# Patient Record
Sex: Female | Born: 1950 | Race: White | Hispanic: No | Marital: Married
Health system: Southern US, Community
[De-identification: ages and names within clinical notes are randomized; demographics above are authoritative.]

---

## 1997-09-02 ENCOUNTER — Other Ambulatory Visit: Admission: RE | Admit: 1997-09-02 | Discharge: 1997-09-02 | Payer: Self-pay | Admitting: Obstetrics and Gynecology

## 1998-03-02 ENCOUNTER — Ambulatory Visit (HOSPITAL_COMMUNITY): Admission: RE | Admit: 1998-03-02 | Discharge: 1998-03-02 | Payer: Self-pay | Admitting: Obstetrics and Gynecology

## 1998-10-07 ENCOUNTER — Other Ambulatory Visit: Admission: RE | Admit: 1998-10-07 | Discharge: 1998-10-07 | Payer: Self-pay | Admitting: Obstetrics and Gynecology

## 1999-03-09 ENCOUNTER — Ambulatory Visit (HOSPITAL_COMMUNITY): Admission: RE | Admit: 1999-03-09 | Discharge: 1999-03-09 | Payer: Self-pay | Admitting: Obstetrics and Gynecology

## 1999-03-09 ENCOUNTER — Encounter: Payer: Self-pay | Admitting: Obstetrics and Gynecology

## 1999-08-25 ENCOUNTER — Encounter: Payer: Self-pay | Admitting: General Surgery

## 1999-08-30 ENCOUNTER — Encounter (INDEPENDENT_AMBULATORY_CARE_PROVIDER_SITE_OTHER): Payer: Self-pay

## 1999-08-30 ENCOUNTER — Ambulatory Visit (HOSPITAL_COMMUNITY): Admission: RE | Admit: 1999-08-30 | Discharge: 1999-08-30 | Payer: Self-pay | Admitting: General Surgery

## 1999-11-18 ENCOUNTER — Other Ambulatory Visit: Admission: RE | Admit: 1999-11-18 | Discharge: 1999-11-18 | Payer: Self-pay | Admitting: Obstetrics and Gynecology

## 2000-03-13 ENCOUNTER — Encounter: Payer: Self-pay | Admitting: Obstetrics and Gynecology

## 2000-03-13 ENCOUNTER — Ambulatory Visit (HOSPITAL_COMMUNITY): Admission: RE | Admit: 2000-03-13 | Discharge: 2000-03-13 | Payer: Self-pay | Admitting: Obstetrics and Gynecology

## 2001-01-25 ENCOUNTER — Encounter: Admission: RE | Admit: 2001-01-25 | Discharge: 2001-01-25 | Payer: Self-pay | Admitting: Family Medicine

## 2001-01-25 ENCOUNTER — Encounter: Payer: Self-pay | Admitting: Family Medicine

## 2001-01-26 ENCOUNTER — Other Ambulatory Visit: Admission: RE | Admit: 2001-01-26 | Discharge: 2001-01-26 | Payer: Self-pay | Admitting: Obstetrics and Gynecology

## 2001-02-28 ENCOUNTER — Encounter: Admission: RE | Admit: 2001-02-28 | Discharge: 2001-03-13 | Payer: Self-pay | Admitting: Family Medicine

## 2001-03-27 ENCOUNTER — Encounter: Payer: Self-pay | Admitting: Obstetrics and Gynecology

## 2001-03-27 ENCOUNTER — Ambulatory Visit (HOSPITAL_COMMUNITY): Admission: RE | Admit: 2001-03-27 | Discharge: 2001-03-27 | Payer: Self-pay | Admitting: Obstetrics and Gynecology

## 2002-04-02 ENCOUNTER — Encounter: Admission: RE | Admit: 2002-04-02 | Discharge: 2002-04-02 | Payer: Self-pay | Admitting: Obstetrics and Gynecology

## 2002-04-02 ENCOUNTER — Encounter: Payer: Self-pay | Admitting: Obstetrics and Gynecology

## 2002-08-27 ENCOUNTER — Encounter: Payer: Self-pay | Admitting: Family Medicine

## 2002-08-27 ENCOUNTER — Encounter: Admission: RE | Admit: 2002-08-27 | Discharge: 2002-08-27 | Payer: Self-pay | Admitting: Family Medicine

## 2002-09-17 ENCOUNTER — Encounter: Payer: Self-pay | Admitting: General Surgery

## 2002-09-17 ENCOUNTER — Encounter: Admission: RE | Admit: 2002-09-17 | Discharge: 2002-09-17 | Payer: Self-pay | Admitting: General Surgery

## 2002-09-17 ENCOUNTER — Encounter (INDEPENDENT_AMBULATORY_CARE_PROVIDER_SITE_OTHER): Payer: Self-pay | Admitting: *Deleted

## 2002-09-17 ENCOUNTER — Ambulatory Visit (HOSPITAL_BASED_OUTPATIENT_CLINIC_OR_DEPARTMENT_OTHER): Admission: RE | Admit: 2002-09-17 | Discharge: 2002-09-17 | Payer: Self-pay | Admitting: General Surgery

## 2003-01-01 ENCOUNTER — Encounter: Payer: Self-pay | Admitting: Diagnostic Radiology

## 2003-01-01 ENCOUNTER — Encounter: Admission: RE | Admit: 2003-01-01 | Discharge: 2003-01-01 | Payer: Self-pay | Admitting: Neurological Surgery

## 2003-01-01 ENCOUNTER — Encounter: Payer: Self-pay | Admitting: Neurological Surgery

## 2003-03-20 ENCOUNTER — Encounter: Admission: RE | Admit: 2003-03-20 | Discharge: 2003-03-20 | Payer: Self-pay | Admitting: Neurological Surgery

## 2003-03-20 ENCOUNTER — Encounter: Payer: Self-pay | Admitting: Neurological Surgery

## 2003-04-07 ENCOUNTER — Encounter: Admission: RE | Admit: 2003-04-07 | Discharge: 2003-04-07 | Payer: Self-pay | Admitting: Neurological Surgery

## 2003-04-07 ENCOUNTER — Encounter: Admission: RE | Admit: 2003-04-07 | Discharge: 2003-04-07 | Payer: Self-pay | Admitting: Obstetrics and Gynecology

## 2003-06-17 ENCOUNTER — Ambulatory Visit (HOSPITAL_COMMUNITY): Admission: RE | Admit: 2003-06-17 | Discharge: 2003-06-17 | Payer: Self-pay | Admitting: Neurological Surgery

## 2003-07-08 ENCOUNTER — Inpatient Hospital Stay (HOSPITAL_COMMUNITY): Admission: RE | Admit: 2003-07-08 | Discharge: 2003-07-11 | Payer: Self-pay | Admitting: Neurological Surgery

## 2004-04-12 ENCOUNTER — Encounter: Admission: RE | Admit: 2004-04-12 | Discharge: 2004-04-12 | Payer: Self-pay | Admitting: Obstetrics and Gynecology

## 2004-12-20 ENCOUNTER — Ambulatory Visit: Payer: Self-pay | Admitting: Family Medicine

## 2004-12-23 ENCOUNTER — Encounter: Admission: RE | Admit: 2004-12-23 | Discharge: 2004-12-23 | Payer: Self-pay | Admitting: Family Medicine

## 2005-01-10 ENCOUNTER — Ambulatory Visit: Payer: Self-pay | Admitting: Family Medicine

## 2005-02-02 ENCOUNTER — Ambulatory Visit: Payer: Self-pay | Admitting: Family Medicine

## 2005-04-22 ENCOUNTER — Encounter: Admission: RE | Admit: 2005-04-22 | Discharge: 2005-04-22 | Payer: Self-pay | Admitting: Obstetrics and Gynecology

## 2006-04-24 ENCOUNTER — Encounter: Admission: RE | Admit: 2006-04-24 | Discharge: 2006-04-24 | Payer: Self-pay | Admitting: Obstetrics and Gynecology

## 2006-09-04 IMAGING — CR DG FOOT COMPLETE 3+V*L*
3 series · 3 of 3 positions shown · non-contrast
Comparison: none

CLINICAL DATA: Joint pain in both hands and fingers and in the feet.
 LEFT HAND, THREE VIEWS:
 Examination is normal.  No evidence of degenerative or inflammatory arthropathy or any other focal abnormality.

[view not recorded (1 of 3)]
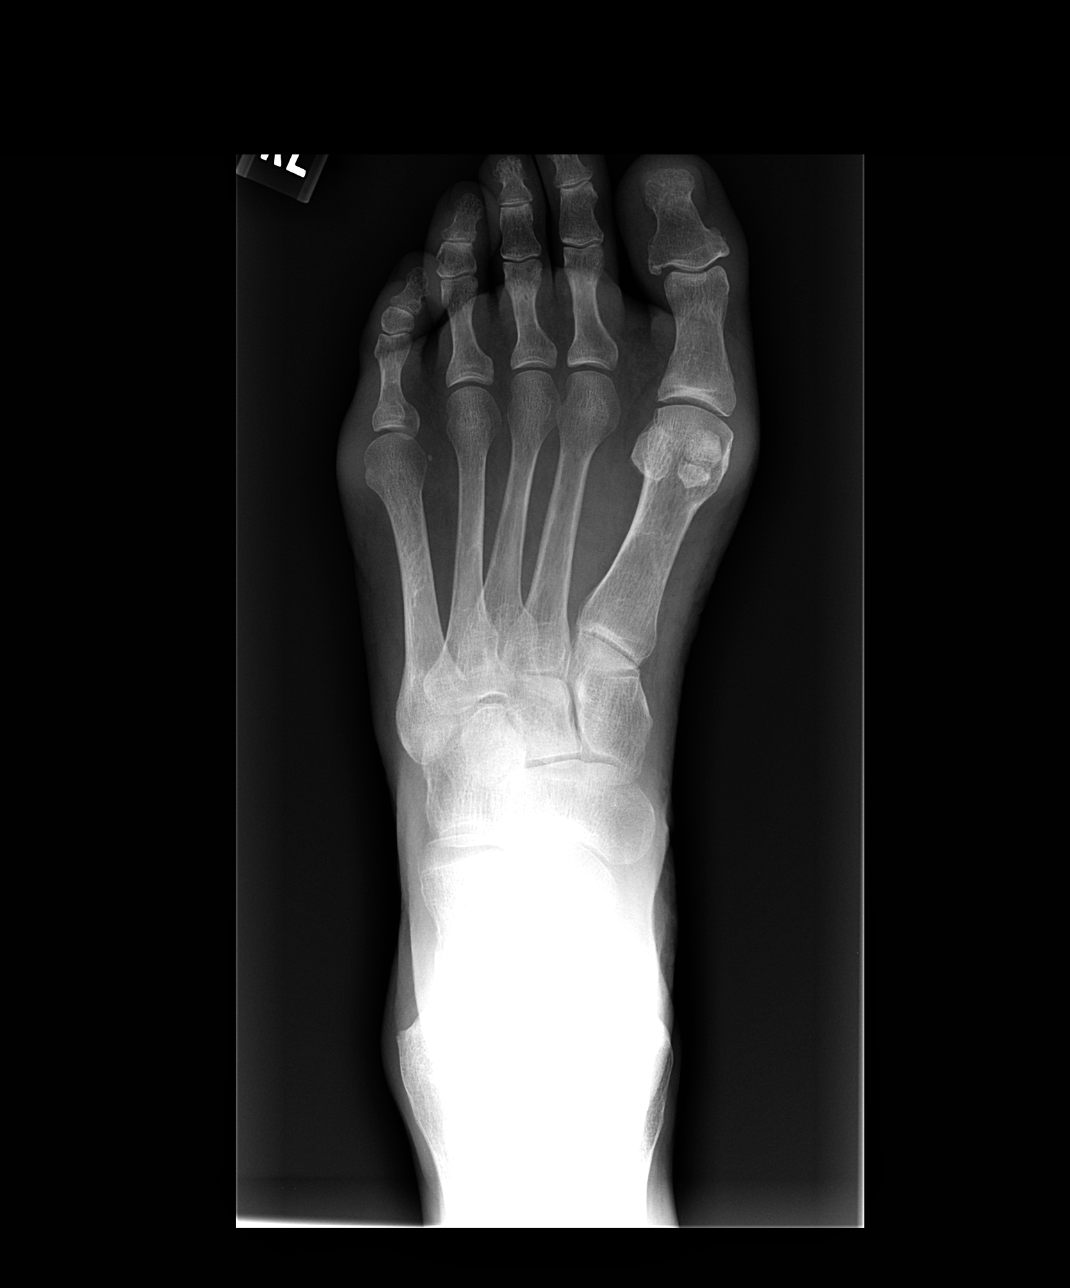

[view not recorded (2 of 3)]
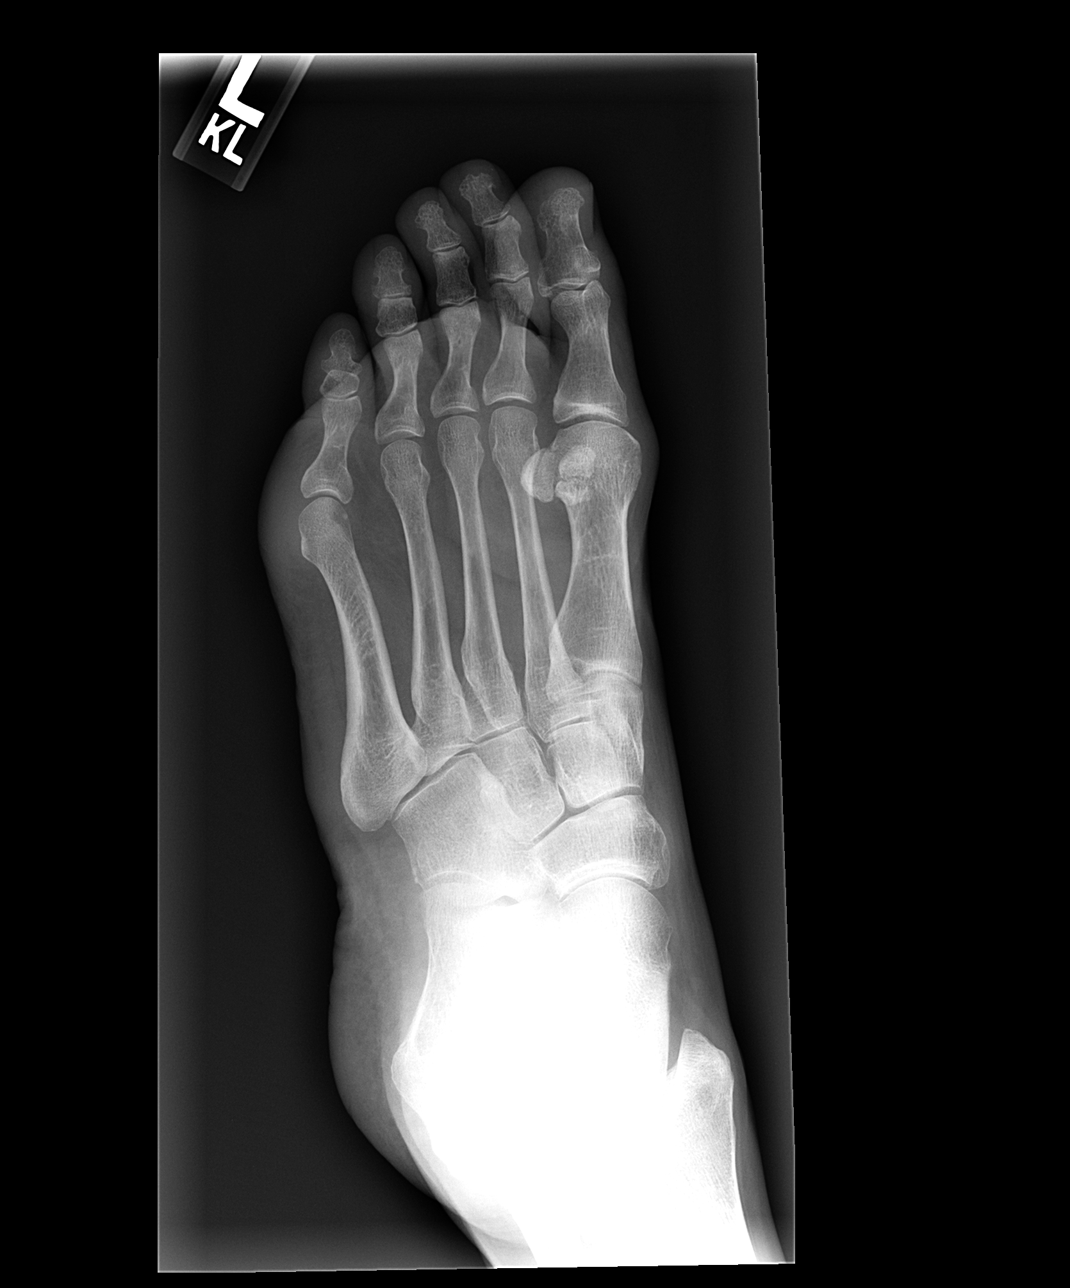

[view not recorded (3 of 3)]
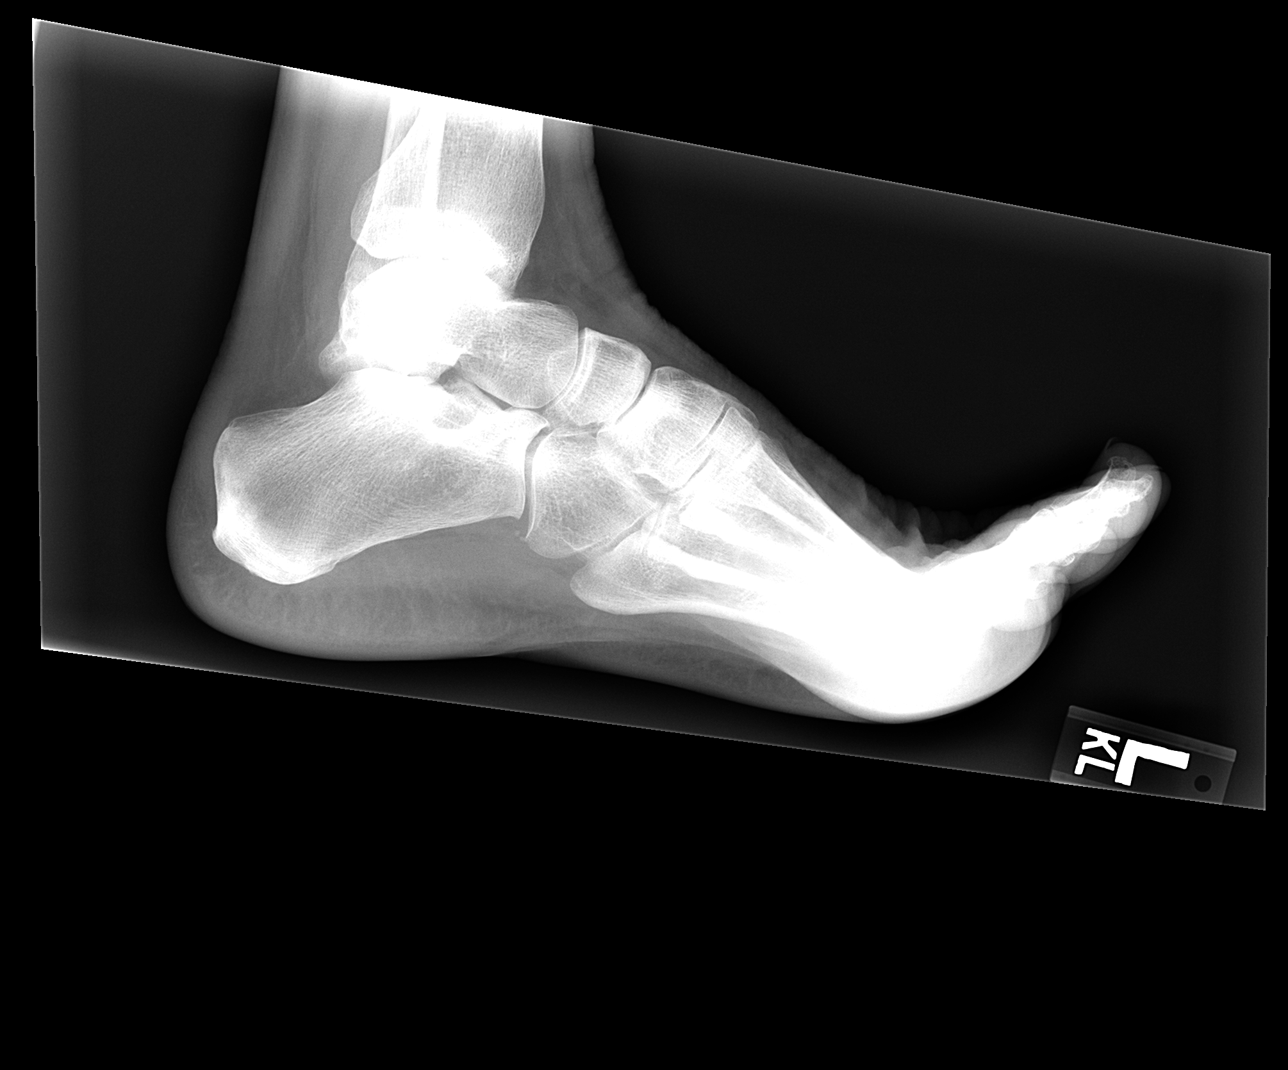

[3 of 3 positions shown; findings below may reference images not displayed]

IMPRESSION: Normal.
 RIGHT HAND, THREE VIEWS:
 Examination is normal without any evidence of degenerative or inflammatory arthropathy.  No focal lesion.
IMPRESSION: Normal.
 LEFT FOOT, THREE VIEWS:
 No evidence of degenerative change, malalignment, or inflammatory arthropathy.
IMPRESSION: Normal.

## 2006-12-19 ENCOUNTER — Encounter: Admission: RE | Admit: 2006-12-19 | Discharge: 2006-12-19 | Payer: Self-pay | Admitting: Obstetrics and Gynecology

## 2007-04-26 ENCOUNTER — Encounter: Admission: RE | Admit: 2007-04-26 | Discharge: 2007-04-26 | Payer: Self-pay | Admitting: Obstetrics and Gynecology

## 2008-05-08 ENCOUNTER — Encounter: Admission: RE | Admit: 2008-05-08 | Discharge: 2008-05-08 | Payer: Self-pay | Admitting: Obstetrics and Gynecology

## 2009-04-22 ENCOUNTER — Encounter: Admission: RE | Admit: 2009-04-22 | Discharge: 2009-04-22 | Payer: Self-pay | Admitting: Obstetrics and Gynecology

## 2009-05-18 ENCOUNTER — Encounter: Admission: RE | Admit: 2009-05-18 | Discharge: 2009-05-18 | Payer: Self-pay | Admitting: Obstetrics and Gynecology

## 2010-06-25 ENCOUNTER — Other Ambulatory Visit: Payer: Self-pay | Admitting: Obstetrics and Gynecology

## 2010-06-27 ENCOUNTER — Encounter: Payer: Self-pay | Admitting: Neurological Surgery

## 2010-07-07 ENCOUNTER — Encounter: Payer: Self-pay | Admitting: Obstetrics and Gynecology

## 2010-08-05 ENCOUNTER — Other Ambulatory Visit: Payer: Self-pay | Admitting: Obstetrics and Gynecology

## 2010-10-07 ENCOUNTER — Other Ambulatory Visit: Payer: Self-pay | Admitting: Obstetrics and Gynecology

## 2010-10-22 NOTE — Op Note (Signed)
NAME:  Carmen Mitchell, Carmen Mitchell                           ACCOUNT NO.:  [id number]   MEDICAL RECORD NO.:  [id number]                   PATIENT TYPE:  AMB   LOCATION:  DSC                                  FACILITY:  [facility]   PHYSICIAN:  Timothy E. Earlene Plater, M.D.              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   OPERATION:  Excision of two lesions left breast with needle localization.   SURGEON:  Timothy E. Earlene Plater, M.D.   ANESTHESIA:  MAC   INDICATIONS FOR PROCEDURE:  This 60 year old Caucasian female has one  palpable, one nonpalpable lesion left breast though they appear to be  fibroadenoma by both mammography and ultrasound.  She and her husband have  discussed this and wish to have a complete excisional biopsy and this has  been carefully discussed.  She was at the breast center this morning.  She  had an excellent needle localization of both lesions.  She is here now and  has been evaluated by anesthesia.  The operative permit is signed.   DESCRIPTION OF PROCEDURE:  She was taken to the operating room, placed  supine, IV started, sedation given.  The left arm was out stretched.  The  breast was carefully examined.  There were two wires appropriately labeled  #1 and 2 and on the skin overlying these areas was an X numbered 1 and 2  indicating the lesions directly beneath the skin in those locations.  The  breast was carefully prepped and draped in the usual fashion.  Marcaine  0.25% with epinephrine was used for local anesthesia.  I elected to place a  curvilinear incision between the two lesions.  #1 lesion was in the 1:00  position 1 cm from the areolar edge.  #2 lesion was deeper in the breast but  at approximately the 2:00 position  approximately 2 cm outside the areolar  edge.  I placed my incision between these two.  I first worked towards the  areola, followed the wire and indeed there was a palpable and visible  fibroadenoma.  I removed that along with its wire from the surrounding  breast tissue and submitted it as #1.  Bleeding was controlled with a  cautery.  That wound was dried and then working out away from the areola  through the breast and subcutaneous tissue I found the old blood tract from  the needle tract, followed that and found the wire and then from the wire, I  followed that into the breast tissue removing a small piece of apparent  normal breast tissue and as well as the fibroadenoma perfectly skewered by  the localizing wire.  This was submitted as #2.  Bleeding was controlled  with  the cautery.  The wound was dry and the breast looked fine.  I  reapproximated the deep subcutaneous with 3-0 Monocryl, the skin with 3-0  Monocryl, Steri-Strips and dry sterile dressing.  Counts were correct.  She  tolerated it well and was removed to the recovery room in good condition.                                               Timothy E. Earlene Plater, M.D.    TED/MEDQ  D:  09/17/2002  T:  09/17/2002  Job:  [id number]   cc:   Tinnie Gens A. Tawanna Cooler, M.D. [facility]   [facility]

## 2010-10-22 NOTE — Discharge Summary (Signed)
NAME:  Carmen Mitchell, Carmen Mitchell                           ACCOUNT NO.:  [id number]   MEDICAL RECORD NO.:  [id number]                   PATIENT TYPE:  INP   LOCATION:  3002                                 FACILITY:  [facility]   PHYSICIAN:  Stefani Dama, M.D.               DATE OF BIRTH:  26-Jul-1950   DATE OF ADMISSION:  07/08/2003  DATE OF DISCHARGE:  07/11/2003                                 DISCHARGE SUMMARY   ADMISSION DIAGNOSIS:  Spondylolisthesis of the lumbar spine, L4-L5, with  severe lumbar radiculopathy.   DISCHARGE DIAGNOSIS:  Spondylolisthesis of the lumbar spine, L4-L5, with  severe lumbar radiculopathy.   OPERATION/PROCEDURE:  L4 Gill procedure, posterior lumbar interbody  arthrodesis with pedicle screw fixation and allograft and autograft on  July 08, 2003.   CONDITION ON DISCHARGE:  Improving.   HOSPITAL COURSE:  Mrs. Carmen Mitchell is a 60 year old individual who has had  severe and unrelenting back and bilateral leg pain.  She has high grade  stenosis due to grade 2 spondylolisthesis.  She was evaluated for this in  the office and advised regarding surgical decompression.  In fact, she had  been treated for a number of years with conservative management and only  reluctantly consented to surgery after she was found to have significant  bilateral weakness evolving in her lower extremities.  She underwent the  surgery and postoperatively she noted an immediate improvement in her leg  pain.  Her incisional pain appears to be well-controlled with oral narcotic  analgesics and she is ambulatory on the third postoperative day.  She is  discharged home with prescription for Percocet as needed for pain, Valium as  muscle relaxant.  She will be seen in the office in three weeks for further followup.  Significant blood loss was not encountered during this procedure.  No  transfusions were performed.                                                Stefani Dama, M.D.    Carmen Mitchell  D:  08/21/2003  T:  08/25/2003  Job:  [id number]

## 2010-10-22 NOTE — Op Note (Signed)
NAME:  Carmen Mitchell, Carmen Mitchell                           ACCOUNT NO.:  [id number]   MEDICAL RECORD NO.:  [id number]                   PATIENT TYPE:  INP   LOCATION:  3002                                 FACILITY:  [facility]   PHYSICIAN:  Stefani Dama, M.D.               DATE OF BIRTH:  [birth year]   DATE OF PROCEDURE:  07/08/2003  DATE OF DISCHARGE:                                 OPERATIVE REPORT   PREOPERATIVE DIAGNOSIS:  Spondylolisthesis L4-L5 with lumbar radiculopathy  and spinal stenosis.   POSTOPERATIVE DIAGNOSIS:  Spondylolisthesis L4-L5 with lumbar radiculopathy  and spinal stenosis.   PROCEDURE:  L4 Gill procedure, posterior lumbar interbody fusion with  Synthes bone spacers, pedicle screw fixation L4-L5, posterior fusion  augmentation with local autograft and allograft.   SURGEON:  Stefani Dama, M.D.   FIRST ASSISTANT:  Payton Doughty, M.D.   ANESTHESIA:  General endotracheal anesthesia.   INDICATIONS FOR PROCEDURE:  The patient is a 60 year old individual who has  had significant back and bilateral leg pain and weakness.  She has had a  high grade stenosis demonstrated at L4-L5 with a severe spondylolisthesis at  the L4-L5 level.  The patient was advised regarding surgical decompression  and stabilization.  She is taken to the operating room for this procedure.   PROCEDURE:  The patient was brought to the operating room supine on the  stretcher.  After a smooth induction of general endotracheal anesthesia, she  was turned prone and the back was shaved, prepped with DuraPrep and draped  in a sterile fashion.  A midline incision was created and carried down to  the lumbodorsal fascia which was opened on either side of the midline to  expose the listhetic segment of L4.  This was freely mobile.  Positive  radiographic confirmation of the area was obtained and then the intralaminar  space at L4-L5 was cleared and the laminar arch of L4 was removed to expose  the loosened facet  joints, these were removed on either side.  The common  dural tube and the take off of the L4 nerve root was exposed superiorly.  The L5 nerve roots were exposed inferiorly and the interspace was  decompressed.  Thickened, redundant yellow ligament was taken up in this  area.  The common dural tube was then retracted medially and discectomy was  performed, first on the right side and then on the left side.  Significant  quantity of severely degenerated disc material was removed from the disc  space and then the endplates were rongeured smooth with a combination of  curets, rongeurs, and rasps to prepare the endplates for ultimate fusion.   The interspaces were spread with, first, a laminar spreader and then the  interspace was opened further by placing a trial PLIF bone spacer made of  PEEK and this was distracted to the 11 mm height.  The endplates were then  appropriately sized and shaped and ultimately, bone graft of the patient's  own was mixed with allograft and platelet rich mixture to form the fusion  material.  This was packed into the interspace.  The cages were similarly  packed and then the cage were placed into the interspace.  Pedicle entry  sites were then chosen.  On the right side, there was noted to be some  difficulty placing the pedicle as the lateral margin of the pedicle wall was  loose and fractured laterally.  A site slightly more inferiorly and more  medially was then chosen and ultimately a 7 mm by 35 mm long pedicle screw  was placed into the right L4 pedicle.  On the left side, a 6.5 by 35 mm  screw was placed on a single pass.  The L5 screws were placed without  difficulty.  The caps were then applied after reaming the heads  appropriately and then the rods were placed, these were 35 mm precontoured  rods, into position.  A slight amount of lordosis was placed into the  construct and it was locked down into position.  The posterior graft was  placed between the  laminar arch of L4 that remained and the laminar arch of  L5.  This was done with Vitoss bone sponge that was soaked in the patient's  own blood.  Allograft was also packed into this space.  In the end, it was  checked that the L4 nerve roots exited freely, the L5 nerve roots were well  decompressed.  Hemostasis in the soft tissues was obtained.  The lumbodorsal  fascia was closed with #1 Vicryl in an interrupted fashion, 2-0 Vicryl was  used in the subcutaneous tissues, 3-0 Vicryl was used to close the  subcuticular skin.  Blood loss was estimated at 350 mL.  The patient  tolerated the procedure well and was returned to the recovery room in stable  condition.                                               Stefani Dama, M.D.    Merla Riches  D:  07/08/2003  T:  07/08/2003  Job:  [id number]

## 2011-02-10 ENCOUNTER — Other Ambulatory Visit: Payer: Self-pay | Admitting: Obstetrics and Gynecology

## 2014-08-28 ENCOUNTER — Other Ambulatory Visit: Payer: Self-pay | Admitting: Obstetrics and Gynecology

## 2014-08-28 DIAGNOSIS — R928 Other abnormal and inconclusive findings on diagnostic imaging of breast: Secondary | ICD-10-CM

## 2014-09-01 ENCOUNTER — Ambulatory Visit
Admission: RE | Admit: 2014-09-01 | Discharge: 2014-09-01 | Disposition: A | Discharge status: Home / Self Care | Payer: BLUE CROSS/BLUE SHIELD | Source: Ambulatory Visit | Attending: Obstetrics and Gynecology | Admitting: Obstetrics and Gynecology

## 2014-09-01 ENCOUNTER — Other Ambulatory Visit: Payer: Self-pay | Admitting: Obstetrics and Gynecology

## 2014-09-01 DIAGNOSIS — R928 Other abnormal and inconclusive findings on diagnostic imaging of breast: Secondary | ICD-10-CM

## 2015-09-16 ENCOUNTER — Telehealth: Payer: Self-pay | Admitting: Family Medicine

## 2015-09-16 NOTE — Telephone Encounter (Signed)
Spoke with patient and gave her the phone number to medical records

## 2015-09-16 NOTE — Telephone Encounter (Signed)
Pt would like for you to call her back.  (concerning old records on her cholesterol).  [place]
